# Patient Record
Sex: Male | Born: 1980 | Race: White | Hispanic: No | Marital: Married | Smoking: Current every day smoker
Health system: Southern US, Community
[De-identification: ages and names within clinical notes are randomized; demographics above are authoritative.]

## PROBLEM LIST (undated history)

## (undated) DIAGNOSIS — F1910 Other psychoactive substance abuse, uncomplicated: Secondary | ICD-10-CM

---

## 2019-01-30 ENCOUNTER — Inpatient Hospital Stay (HOSPITAL_COMMUNITY): Payer: Self-pay

## 2019-01-30 ENCOUNTER — Encounter (HOSPITAL_COMMUNITY): Payer: Self-pay

## 2019-01-30 ENCOUNTER — Inpatient Hospital Stay (HOSPITAL_COMMUNITY)
Admission: EM | Admit: 2019-01-30 | Discharge: 2019-01-30 | DRG: 918 | Discharge status: Against Medical Advice | Payer: Self-pay | Attending: Internal Medicine | Admitting: Internal Medicine

## 2019-01-30 ENCOUNTER — Other Ambulatory Visit: Payer: Self-pay

## 2019-01-30 ENCOUNTER — Emergency Department (HOSPITAL_COMMUNITY): Payer: Self-pay

## 2019-01-30 DIAGNOSIS — F1510 Other stimulant abuse, uncomplicated: Secondary | ICD-10-CM | POA: Diagnosis present

## 2019-01-30 DIAGNOSIS — N189 Chronic kidney disease, unspecified: Secondary | ICD-10-CM

## 2019-01-30 DIAGNOSIS — X58XXXA Exposure to other specified factors, initial encounter: Secondary | ICD-10-CM | POA: Diagnosis present

## 2019-01-30 DIAGNOSIS — R443 Hallucinations, unspecified: Secondary | ICD-10-CM | POA: Diagnosis present

## 2019-01-30 DIAGNOSIS — Z23 Encounter for immunization: Secondary | ICD-10-CM

## 2019-01-30 DIAGNOSIS — F22 Delusional disorders: Secondary | ICD-10-CM

## 2019-01-30 DIAGNOSIS — T43621A Poisoning by amphetamines, accidental (unintentional), initial encounter: Principal | ICD-10-CM | POA: Diagnosis present

## 2019-01-30 DIAGNOSIS — Z720 Tobacco use: Secondary | ICD-10-CM

## 2019-01-30 DIAGNOSIS — R410 Disorientation, unspecified: Secondary | ICD-10-CM

## 2019-01-30 DIAGNOSIS — N179 Acute kidney failure, unspecified: Secondary | ICD-10-CM

## 2019-01-30 DIAGNOSIS — F1910 Other psychoactive substance abuse, uncomplicated: Secondary | ICD-10-CM

## 2019-01-30 DIAGNOSIS — S41112A Laceration without foreign body of left upper arm, initial encounter: Secondary | ICD-10-CM | POA: Diagnosis present

## 2019-01-30 DIAGNOSIS — Z888 Allergy status to other drugs, medicaments and biological substances status: Secondary | ICD-10-CM

## 2019-01-30 DIAGNOSIS — T148XXA Other injury of unspecified body region, initial encounter: Secondary | ICD-10-CM

## 2019-01-30 DIAGNOSIS — T50901A Poisoning by unspecified drugs, medicaments and biological substances, accidental (unintentional), initial encounter: Secondary | ICD-10-CM | POA: Diagnosis present

## 2019-01-30 DIAGNOSIS — T1490XA Injury, unspecified, initial encounter: Secondary | ICD-10-CM

## 2019-01-30 DIAGNOSIS — F17210 Nicotine dependence, cigarettes, uncomplicated: Secondary | ICD-10-CM | POA: Diagnosis present

## 2019-01-30 DIAGNOSIS — L089 Local infection of the skin and subcutaneous tissue, unspecified: Secondary | ICD-10-CM

## 2019-01-30 DIAGNOSIS — T50902A Poisoning by unspecified drugs, medicaments and biological substances, intentional self-harm, initial encounter: Secondary | ICD-10-CM

## 2019-01-30 DIAGNOSIS — L03114 Cellulitis of left upper limb: Secondary | ICD-10-CM | POA: Diagnosis present

## 2019-01-30 DIAGNOSIS — R4182 Altered mental status, unspecified: Secondary | ICD-10-CM

## 2019-01-30 DIAGNOSIS — F1210 Cannabis abuse, uncomplicated: Secondary | ICD-10-CM | POA: Diagnosis present

## 2019-01-30 HISTORY — DX: Other psychoactive substance abuse, uncomplicated: F19.10

## 2019-01-30 LAB — CK: CK TOTAL: 905 U/L — AB (ref 49–397)

## 2019-01-30 LAB — URINALYSIS, ROUTINE W REFLEX MICROSCOPIC
Bilirubin Urine: NEGATIVE
Glucose, UA: NEGATIVE mg/dL
Ketones, ur: 20 mg/dL — AB
Nitrite: NEGATIVE
PH: 5 (ref 5.0–8.0)
Protein, ur: 100 mg/dL — AB
SPECIFIC GRAVITY, URINE: 1.025 (ref 1.005–1.030)

## 2019-01-30 LAB — CBC WITH DIFFERENTIAL/PLATELET
Abs Immature Granulocytes: 0.24 10*3/uL — ABNORMAL HIGH (ref 0.00–0.07)
BASOS ABS: 0.1 10*3/uL (ref 0.0–0.1)
Basophils Relative: 0 %
EOS PCT: 0 %
Eosinophils Absolute: 0 10*3/uL (ref 0.0–0.5)
HCT: 52.5 % — ABNORMAL HIGH (ref 39.0–52.0)
Hemoglobin: 17.4 g/dL — ABNORMAL HIGH (ref 13.0–17.0)
Immature Granulocytes: 1 %
Lymphocytes Relative: 3 %
Lymphs Abs: 0.8 10*3/uL (ref 0.7–4.0)
MCH: 29.9 pg (ref 26.0–34.0)
MCHC: 33.1 g/dL (ref 30.0–36.0)
MCV: 90.2 fL (ref 80.0–100.0)
Monocytes Absolute: 1.7 10*3/uL — ABNORMAL HIGH (ref 0.1–1.0)
Monocytes Relative: 6 %
NRBC: 0 % (ref 0.0–0.2)
Neutro Abs: 24.1 10*3/uL — ABNORMAL HIGH (ref 1.7–7.7)
Neutrophils Relative %: 90 %
Platelets: 368 10*3/uL (ref 150–400)
RBC: 5.82 MIL/uL — ABNORMAL HIGH (ref 4.22–5.81)
RDW: 14.4 % (ref 11.5–15.5)
WBC: 26.8 10*3/uL — AB (ref 4.0–10.5)

## 2019-01-30 LAB — COMPREHENSIVE METABOLIC PANEL
ALT: 33 U/L (ref 0–44)
AST: 66 U/L — ABNORMAL HIGH (ref 15–41)
Albumin: 5.4 g/dL — ABNORMAL HIGH (ref 3.5–5.0)
Alkaline Phosphatase: 90 U/L (ref 38–126)
Anion gap: 18 — ABNORMAL HIGH (ref 5–15)
BUN: 22 mg/dL — AB (ref 6–20)
CO2: 16 mmol/L — ABNORMAL LOW (ref 22–32)
Calcium: 10.1 mg/dL (ref 8.9–10.3)
Chloride: 102 mmol/L (ref 98–111)
Creatinine, Ser: 2.31 mg/dL — ABNORMAL HIGH (ref 0.61–1.24)
GFR calc Af Amer: 40 mL/min — ABNORMAL LOW (ref >60)
GFR calc non Af Amer: 35 mL/min — ABNORMAL LOW (ref >60)
Glucose, Bld: 93 mg/dL (ref 70–99)
Potassium: 5.1 mmol/L (ref 3.5–5.1)
Sodium: 136 mmol/L (ref 135–145)
Total Bilirubin: 2 mg/dL — ABNORMAL HIGH (ref 0.3–1.2)
Total Protein: 9.3 g/dL — ABNORMAL HIGH (ref 6.5–8.1)

## 2019-01-30 LAB — ACETAMINOPHEN LEVEL: Acetaminophen (Tylenol), Serum: <10 ug/mL — ABNORMAL LOW (ref 10–30)

## 2019-01-30 LAB — RAPID URINE DRUG SCREEN, HOSP PERFORMED
Amphetamines: POSITIVE — AB
Barbiturates: NOT DETECTED
Benzodiazepines: NOT DETECTED
Cocaine: NOT DETECTED
Opiates: NOT DETECTED
Tetrahydrocannabinol: POSITIVE — AB

## 2019-01-30 LAB — ETHANOL: Alcohol, Ethyl (B): <10 mg/dL (ref <10)

## 2019-01-30 LAB — PROCALCITONIN: Procalcitonin: 2.84 ng/mL

## 2019-01-30 LAB — LACTIC ACID, PLASMA: Lactic Acid, Venous: 0.9 mmol/L (ref 0.5–1.9)

## 2019-01-30 LAB — SALICYLATE LEVEL: Salicylate Lvl: <7 mg/dL (ref 2.8–30.0)

## 2019-01-30 MED ORDER — VANCOMYCIN HCL 10 G IV SOLR: 2000.0000 mg | INTRAVENOUS | Status: DC

## 2019-01-30 MED ORDER — TETANUS-DIPHTH-ACELL PERTUSSIS 5-2.5-18.5 LF-MCG/0.5 IM SUSP
0.5000 mL | Freq: Once | INTRAMUSCULAR | Status: AC
  Administered 2019-01-30: 0.5 mL via INTRAMUSCULAR
  Filled 2019-01-30: qty 0.5

## 2019-01-30 MED ORDER — ONDANSETRON HCL 4 MG/2ML IJ SOLN: 4.0000 mg | Freq: Four times a day (QID) | INTRAMUSCULAR | Status: DC | PRN

## 2019-01-30 MED ORDER — ENOXAPARIN SODIUM 30 MG/0.3ML ~~LOC~~ SOLN: 30.0000 mg | SUBCUTANEOUS | Status: DC

## 2019-01-30 MED ORDER — LIDOCAINE-EPINEPHRINE (PF) 2 %-1:200000 IJ SOLN
10.0000 mL | Freq: Once | INTRAMUSCULAR | Status: AC
  Administered 2019-01-30: 10 mL via INTRADERMAL
  Filled 2019-01-30: qty 20

## 2019-01-30 MED ORDER — ACETAMINOPHEN 650 MG RE SUPP: 650.0000 mg | Freq: Four times a day (QID) | RECTAL | Status: DC | PRN

## 2019-01-30 MED ORDER — VANCOMYCIN HCL 10 G IV SOLR
2000.0000 mg | Freq: Once | INTRAVENOUS | Status: AC
  Administered 2019-01-30: 2000 mg via INTRAVENOUS
  Filled 2019-01-30: qty 2000

## 2019-01-30 MED ORDER — SODIUM CHLORIDE 0.9 % IV SOLN
2.0000 g | Freq: Once | INTRAVENOUS | Status: AC
  Administered 2019-01-30: 2 g via INTRAVENOUS
  Filled 2019-01-30: qty 2

## 2019-01-30 MED ORDER — ONDANSETRON HCL 4 MG PO TABS: 4.0000 mg | ORAL_TABLET | Freq: Four times a day (QID) | ORAL | Status: DC | PRN

## 2019-01-30 MED ORDER — NICOTINE 14 MG/24HR TD PT24: 14.0000 mg | MEDICATED_PATCH | Freq: Every day | TRANSDERMAL | Status: DC

## 2019-01-30 MED ORDER — SODIUM CHLORIDE 0.9 % IV BOLUS
1000.0000 mL | Freq: Once | INTRAVENOUS | Status: AC
  Administered 2019-01-30: 1000 mL via INTRAVENOUS

## 2019-01-30 MED ORDER — ZIPRASIDONE MESYLATE 20 MG IM SOLR
20.0000 mg | Freq: Once | INTRAMUSCULAR | Status: AC
  Administered 2019-01-30: 20 mg via INTRAMUSCULAR
  Filled 2019-01-30: qty 20

## 2019-01-30 MED ORDER — SODIUM CHLORIDE 0.9 % IV SOLN: INTRAVENOUS | Status: DC

## 2019-01-30 MED ORDER — ACETAMINOPHEN 325 MG PO TABS
650.0000 mg | ORAL_TABLET | Freq: Four times a day (QID) | ORAL | Status: DC | PRN
  Filled 2019-01-30: qty 2

## 2019-01-30 MED ORDER — SODIUM CHLORIDE 0.9 % IV SOLN: 2.0000 g | Freq: Two times a day (BID) | INTRAVENOUS | Status: DC

## 2019-01-30 MED ORDER — BACITRACIN ZINC 500 UNIT/GM EX OINT
TOPICAL_OINTMENT | Freq: Two times a day (BID) | CUTANEOUS | Status: DC
  Administered 2019-01-30: 1 via TOPICAL

## 2019-01-30 NOTE — ED Notes (Signed)
Pt states he ate 15g of dope. [employer] confirms this
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
## 2019-01-30 NOTE — ED Provider Notes (Signed)
Sign out to me from Dr. Erma Heritage pending labs patient has evidence of acute kidney injury with a creatinine of 2.31.  Does have leukocytosis likely from stress with white count 26.8 thousand.  Patient will receive 3 L of saline here and require admission   Lorre Nick, MD 01/30/19 1134

## 2019-01-30 NOTE — Discharge Summary (Signed)
Discharge summary-- Patient left AMA.    Patient is alert and oriented x3.  Patient understands his diagnosis of infection/arm cellulitis.  Patient decided to leave AGAINST MEDICAL ADVICE.  The consequences of leaving AGAINST MEDICAL ADVICE was discussed with him and included but not limited to worsening of infection as well as possible worst outcome of death.  Patient states that he understood and had to leave.  He expressed understanding that he could call [phone] if he needs to come back to the ED. Based on my assessment, he has capacity to make make medical decision.   Patient left AGAINST MEDICAL ADVICE.

## 2019-01-30 NOTE — ED Notes (Signed)
Pt is not suicidal therefore SI sitter order d/c'd

## 2019-01-30 NOTE — Progress Notes (Signed)
Pharmacy Antibiotic Note  Kenneth Rose. is a 38 y.o. male admitted on 01/30/2019 with cellulitis.  Pharmacy has been consulted for vancomycin dosing. Multiple skin abrasions with some deep lacerations to the left arm.  Concerning left arm cellulitis  Plan: Vancomycin 2000 mg IV Q 24 hrs. Goal AUC 400-550. Expected AUC: 531.5 SCr used: 2.31   Height: 6\' 1"  (185.4 cm) Weight: 214 lb (97.1 kg) IBW/kg (Calculated) : 79.9  Temp (24hrs), Avg:97.8 F (36.6 C), Min:97.7 F (36.5 C), Max:97.9 F (36.6 C)  Recent Labs  Lab 01/30/19 0819  WBC 26.8*  CREATININE 2.31*    Estimated Creatinine Clearance: 53.2 mL/min (A) (by C-G formula based on SCr of 2.31 mg/dL (H)).    Allergies  Allergen Reactions  . Insulins     Antimicrobials this admission: 3/14 vanc>>  Dose adjustments this admission:  Microbiology results:  Thank you for allowing pharmacy to be a part of this patient's care.  Herby Abraham, Pharm.D [phone] 01/30/2019 1:42 PM
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
## 2019-01-30 NOTE — ED Provider Notes (Signed)
I was requested to perform laceration repair to L forearms.  Pt has multiple lacerations both deep and superficial noted to L forearm, elbow.  Evidence of retained foreign body noted both on x-rays and on exam.  Best attempt was made to remove as many shards of glass from the wounds but patient was made aware the potential of retained foreign body is possible.  LACERATION REPAIR Performed by: Fayrene Helper Authorized by: Fayrene Helper Consent: Verbal consent obtained. Risks and benefits: risks, benefits and alternatives were discussed Consent given by: patient Patient identity confirmed: provided demographic data Prepped and Draped in normal sterile fashion Wound explored  Laceration Location: L forearm  Laceration Length: 8cm (total length of 5 different lacerations)  Multiple shards of glass of various sizes were found  Anesthesia: local infiltration  Local anesthetic: lidocaine 2% w epinephrine  Anesthetic total: 8 ml  Irrigation method: syringe Amount of cleaning: standard  Skin closure: prolene 5.0  Number of sutures: 14  Technique: simple interrupted  Patient tolerance: Patient tolerated the procedure well with no immediate complications. Fayrene Helper, PA-C 01/30/19 1028    Lorre Nick, MD 01/31/19 [phone]

## 2019-01-30 NOTE — ED Notes (Signed)
Irrigated left lat ankle wound with NS, then applied nonstick dressing to area.

## 2019-01-30 NOTE — ED Notes (Signed)
Pt verbally aggressive toward staff. Also ripping off his armband and other cables that connected to the dinamap to monitor vital signs.

## 2019-01-30 NOTE — ED Notes (Signed)
Bed: WA16 Expected date:  Expected time:  Means of arrival:  Comments: Hall D  

## 2019-01-30 NOTE — Progress Notes (Signed)
See progress note from Len Childs, PharmD. MD adding Cefepime to antibiotic therapy, with request for pharmacy to dose.  SCr 2.31 with CrCl ~ 53 ml/min WBC 26.8K PCT 2.84  Plan:  Cefepime 2g IV q12h Vancomycin as previously ordered Monitor renal function, cultures, clinical course   Greer Pickerel, PharmD, BCPS Pager: [phone] 01/30/2019 4:13 PM

## 2019-01-30 NOTE — ED Provider Notes (Signed)
[facility]-EMERGENCY DEPT Provider Note   CSN: [id number] Arrival date & time: 01/30/19  0534    History   Chief Complaint Chief Complaint  Patient presents with  . Drug Overdose    HPI Kenneth Rose is a 38 y.o. male.     The history is provided by the patient.   38 year old male with no known past medical history brought in by EMS with altered mental status.  Patient reportedly was found by police.  He was acting erratically, paranoid, and hallucinating.  He was suspected to have recently done methamphetamine and he was found with other people who arrested for methamphetamine use.  On my assessment, the patient is unable to provide significant history.  He is agitated, hyperactive, and hallucinating.  He states he is seeing "a lot of things" throughout the ER.  He has flights of ideas and pressured speech.  When asked why he is here, he states that he hears God's voices, which is why is here.  He denies any overt suicidal or homicidal ration.  History reviewed. No pertinent past medical history.  There are no active problems to display for this patient.        Home Medications    Prior to Admission medications   Not on File    Family History No family history on file.  Social History Social History   Tobacco Use  . Smoking status: Unknown If Ever Smoked  Substance Use Topics  . Alcohol use: Yes  . Drug use: Yes    Types: Methamphetamines, Marijuana     Allergies   Insulins   Review of Systems Review of Systems  Unable to perform ROS: Mental status change  Skin: Positive for wound.  Psychiatric/Behavioral: Positive for agitation. The patient is nervous/anxious and is hyperactive.      Physical Exam Updated Vital Signs BP 116/84   Pulse (!) 114   Resp (!) 30   SpO2 95%   Physical Exam Vitals signs and nursing note reviewed.  Constitutional:      General: He is in acute distress.     Appearance: He is well-developed. He is  not ill-appearing.  HENT:     Head: Normocephalic and atraumatic.     Comments: No apparent head trauma.  Dry mucous membranes.    Mouth/Throat:     Mouth: Mucous membranes are moist.  Eyes:     Conjunctiva/sclera: Conjunctivae normal.     Comments: Pupils dilated, but reactive bilaterally  Neck:     Musculoskeletal: Neck supple.  Cardiovascular:     Rate and Rhythm: Regular rhythm. Tachycardia present.     Heart sounds: Normal heart sounds. No murmur. No friction rub.  Pulmonary:     Breath sounds: Normal breath sounds. No wheezing or rales.  Abdominal:     General: There is no distension.     Palpations: Abdomen is soft.     Tenderness: There is no abdominal tenderness.  Skin:    General: Skin is warm.     Capillary Refill: Capillary refill takes less than 2 seconds.     Comments: Scattered abrasions, superficial lac R elbow. Avulsion of tissue L lateral ankle, no deep or gaping wound.  Neurological:     Mental Status: He is alert.     Cranial Nerves: Cranial nerves are intact.     Motor: Motor function is intact. No abnormal muscle tone.     Coordination: Coordination is intact.  Psychiatric:  Attention and Perception: He is inattentive. He perceives auditory and visual hallucinations.        Mood and Affect: Mood is anxious.        Thought Content: Thought content is paranoid.        Judgment: Judgment is impulsive and inappropriate.     UPPER EXTREMITY EXAM: LEFT  INSPECTION & PALPATION: Multiple diffuse lacerations, abrasions L elbow with soft tissue edema. TTP over anterolateral elbow. No crepitance. No palpable foreign body.  SENSORY: Sensation is intact to light touch in:  Superficial radial nerve distribution (dorsal first web space) Median nerve distribution (tip of index finger)   Ulnar nerve distribution (tip of small finger)     MOTOR:  + Motor posterior interosseous nerve (thumb IP extension) + Anterior interosseous nerve (thumb IP flexion, index  finger DIP flexion) + Radial nerve (wrist extension) + Median nerve (palpable firing thenar mass) + Ulnar nerve (palpable firing of first dorsal interosseous muscle)  VASCULAR: 2+ radial pulse Brisk capillary refill < 2 sec, fingers warm and well-perfused   ED Treatments / Results  Labs (all labs ordered are listed, but only abnormal results are displayed) Labs Reviewed  COMPREHENSIVE METABOLIC PANEL  ETHANOL  RAPID URINE DRUG SCREEN, HOSP PERFORMED  CBC WITH DIFFERENTIAL/PLATELET  CK    EKG EKG Interpretation  Date/Time:  Saturday January 30 2019 06:55:11 EDT Ventricular Rate:  132 PR Interval:  136 QRS Duration: 72 QT Interval:  292 QTC Calculation: 432 R Axis:   66 Text Interpretation:  Sinus tachycardia Biatrial enlargement Abnormal ECG No old tracing to compare Confirmed by Shaune Pollack [phone]) on 01/30/2019 7:22:02 AM   Radiology No results found.  Procedures Procedures (including critical care time)  Medications Ordered in ED Medications  lidocaine-EPINEPHrine (XYLOCAINE W/EPI) 2 %-1:200000 (PF) injection 10 mL (has no administration in time range)  sodium chloride 0.9 % bolus 1,000 mL (has no administration in time range)  sodium chloride 0.9 % bolus 1,000 mL (has no administration in time range)  Tdap (BOOSTRIX) injection 0.5 mL (has no administration in time range)  bacitracin ointment (has no administration in time range)  ziprasidone (GEODON) injection 20 mg (20 mg Intramuscular Given by Other 01/30/19 6712)     Initial Impression / Assessment and Plan / ED Course  I have reviewed the triage vital signs and the nursing notes.  Pertinent labs & imaging results that were available during my care of the patient were reviewed by me and considered in my medical decision making (see chart for details).        38 yo M with no known PMHx here with AMS. Clinically, suspect stimulant abuse with acute intoxication, less likely primary mood disorder. Will  check labs. Tetanus updated. Plan to obtain plain films to rule out fx or foreign body, then close wounds (to be closed by Fayrene Helper, PA). F/u labs, including CK. Fluids given.   Plan to f/u labs, re-assess once sober.  Final Clinical Impressions(s) / ED Diagnoses   Final diagnoses:  Stimulant abuse (HCC)  Laceration of multiple sites of left upper extremity, initial encounter    ED Discharge Orders    None       Shaune Pollack, MD 01/30/19 [phone]

## 2019-01-30 NOTE — ED Notes (Signed)
Bed: [facility] Expected date:  Expected time:  Means of arrival:  Comments: 64M meth, agitated, lacs

## 2019-01-30 NOTE — ED Notes (Signed)
ED EKG delayed due to erratic pt behavior.

## 2019-01-30 NOTE — ED Notes (Signed)
Pt anxious and demanding IV be removed. He states he is leaving, "[place] go, my stuff, somebody is gonna get it" Dr Dierdre Searles in to try and speak with pt, He is aware of name, date and where he is, fully ambulatory with steady gait. Pt will not stay long enough to sign AMA, Dr Dierdre Searles did explain his need for hospitalization due to elevation in blood count related to infection. He is aware condition can deterate and even death is a possiblilty. Pt verbalizes understanding as he walks out of the room.

## 2019-01-31 LAB — HIV ANTIBODY (ROUTINE TESTING W REFLEX): HIV Screen 4th Generation wRfx: NONREACTIVE

## 2019-02-04 LAB — CULTURE, BLOOD (ROUTINE X 2)
Culture: NO GROWTH
Culture: NO GROWTH
SPECIAL REQUESTS: ADEQUATE
Special Requests: ADEQUATE

## 2020-03-06 IMAGING — US RENAL/URINARY TRACT ULTRASOUND
1 series · 14 of 25 positions shown · non-contrast
Comparison: None.

CLINICAL DATA: 37-year-old male with acute on chronic renal failure

EXAM:
RENAL / URINARY TRACT ULTRASOUND COMPLETE

[Series 1: renal/urinary tract ultrasound · 14 of 46 slices shown]
[im 1/46]
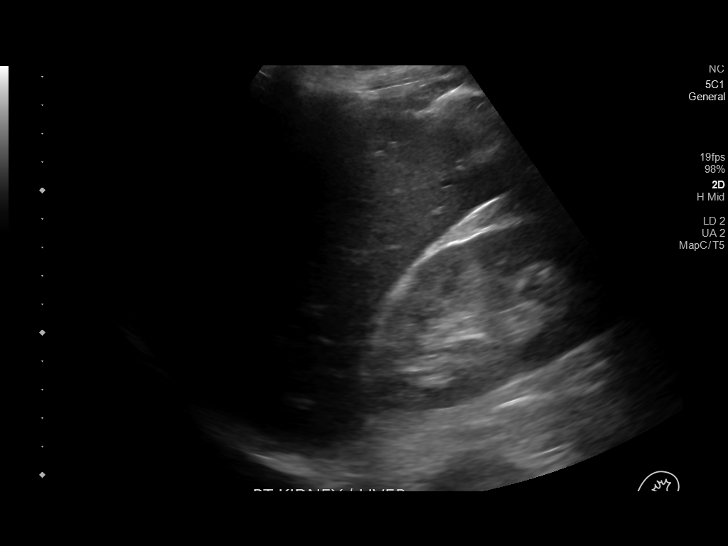
[im 4/46]
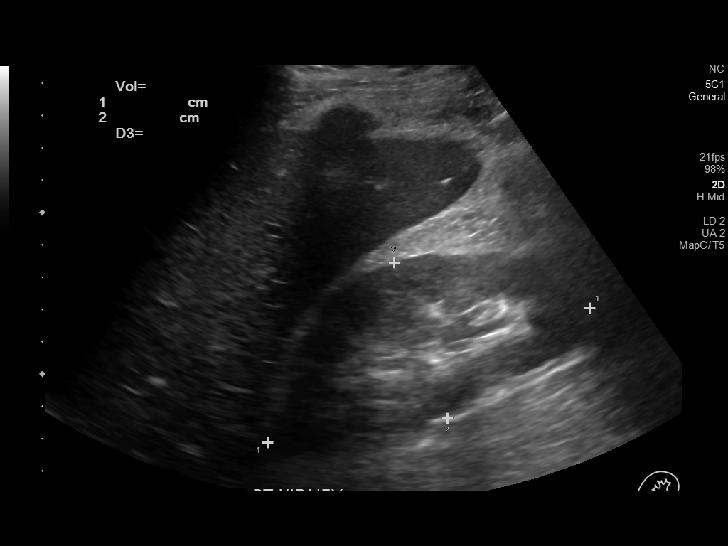
[im 8/46]
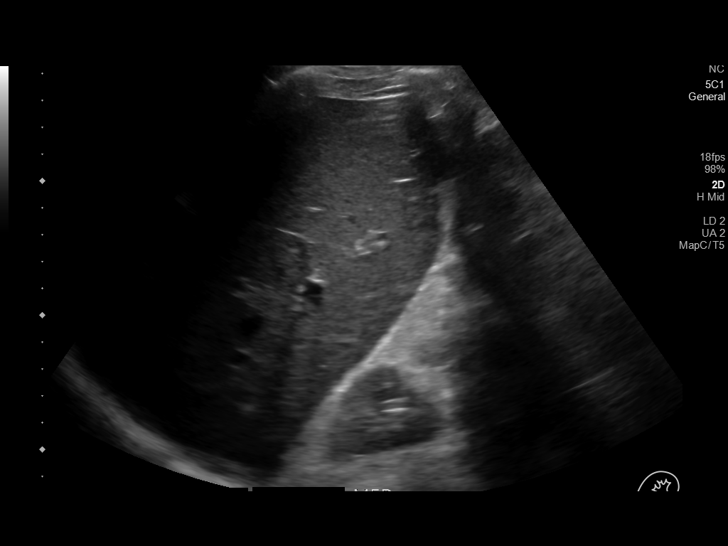
[im 12/46]
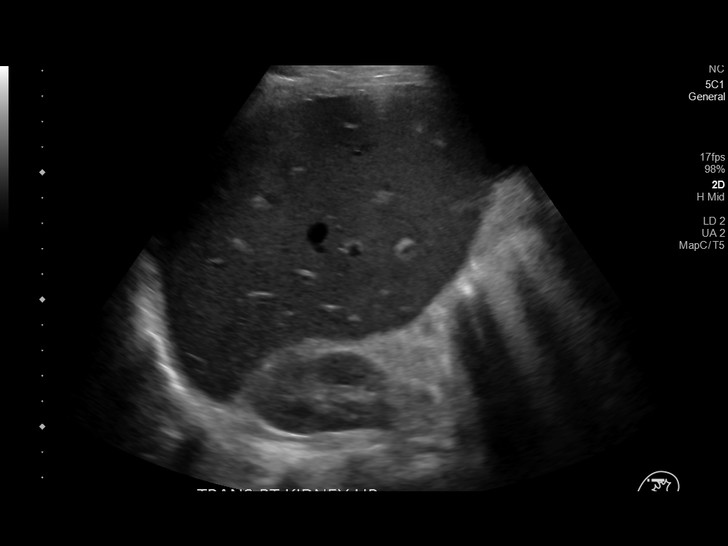
[im 16/46]
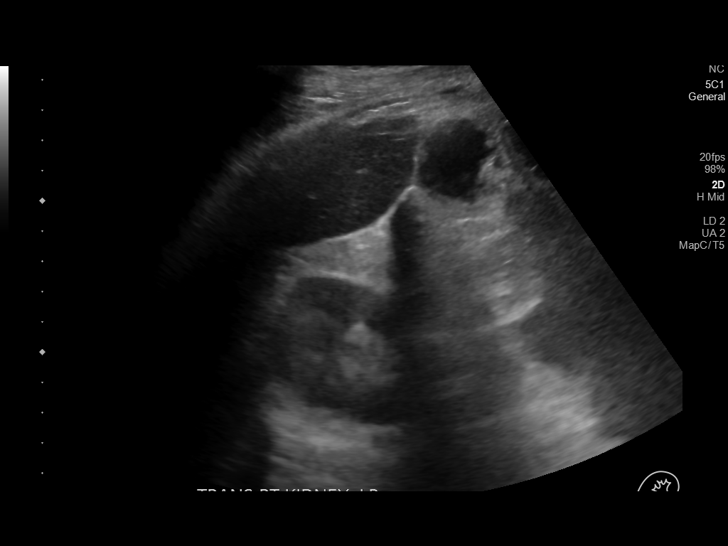
[im 17/46]
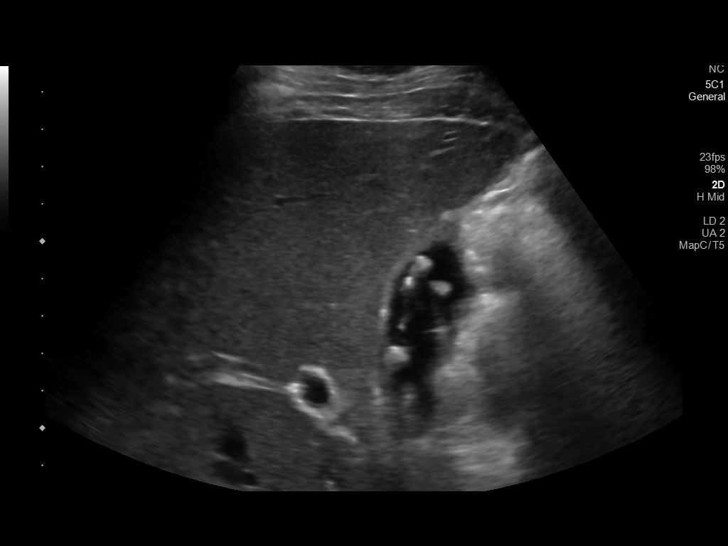
[im 21/46]
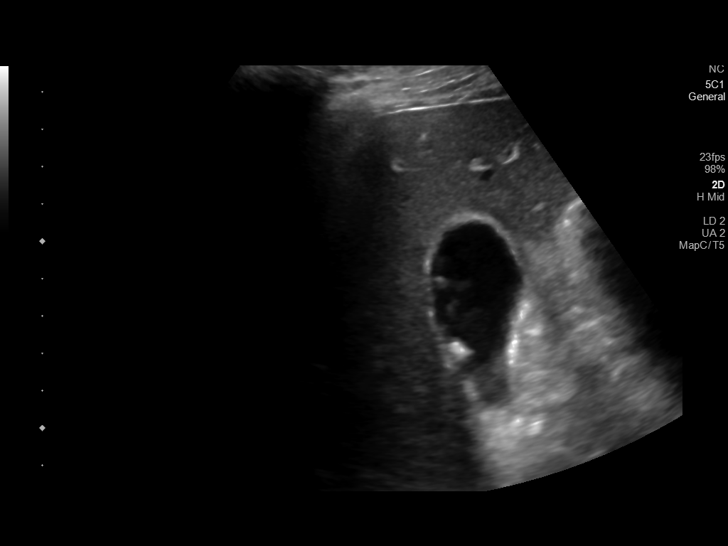
[im 25/46]
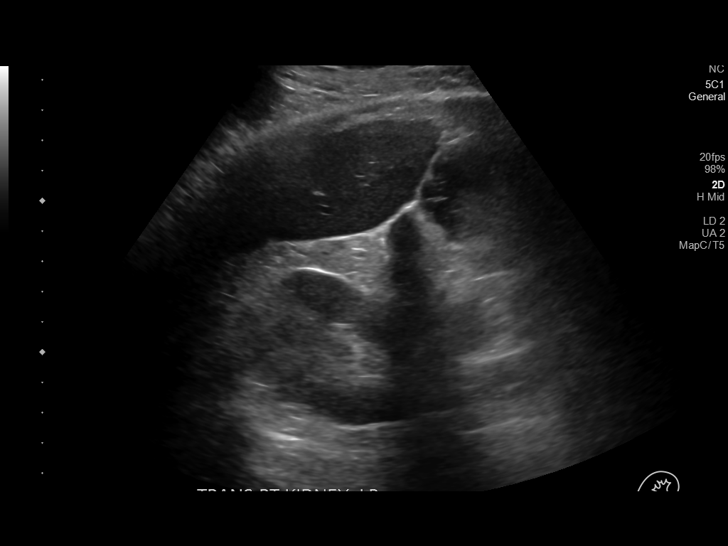
[im 29/46]
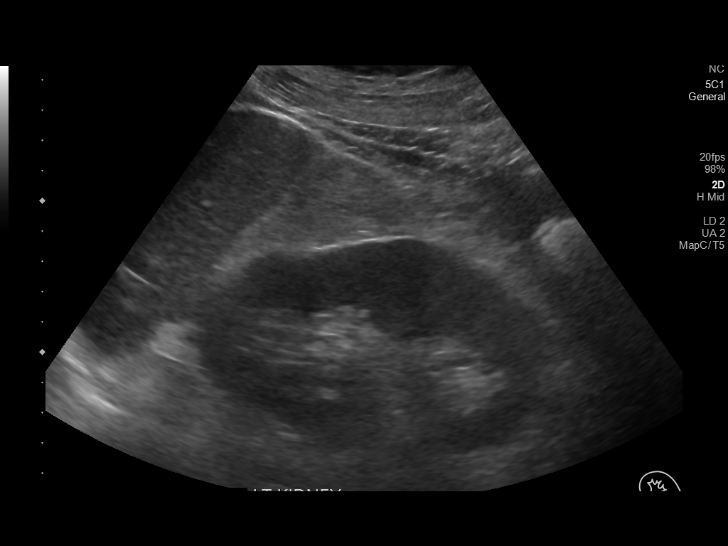
[im 31/46]
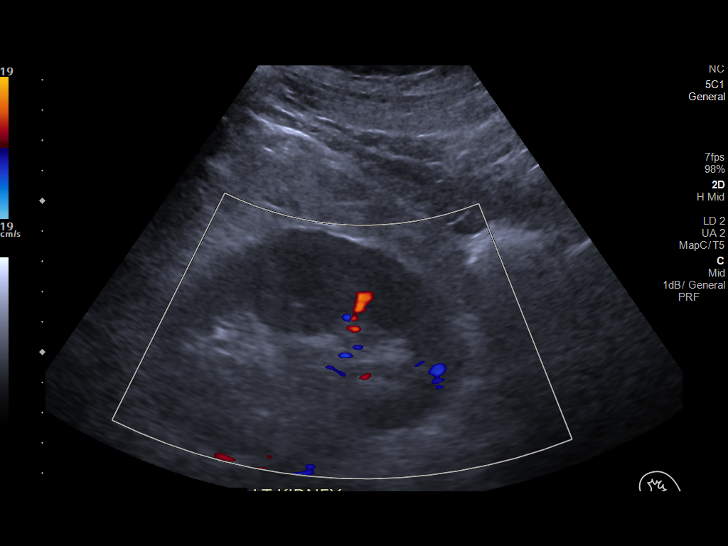
[im 34/46]
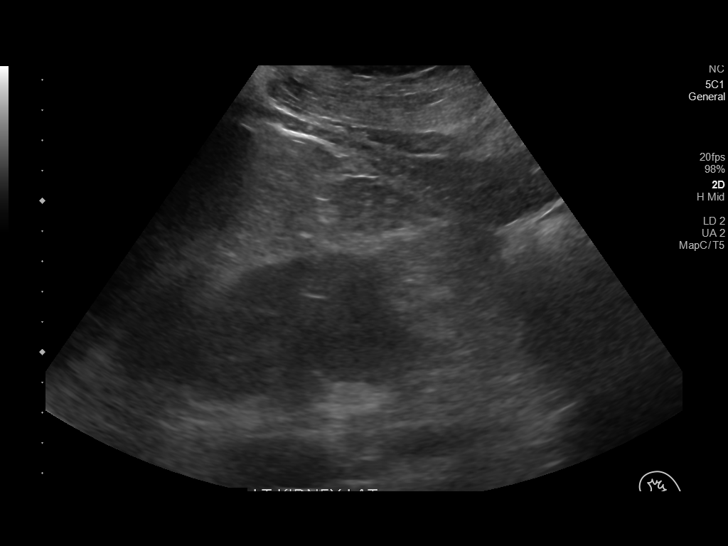
[im 38/46]
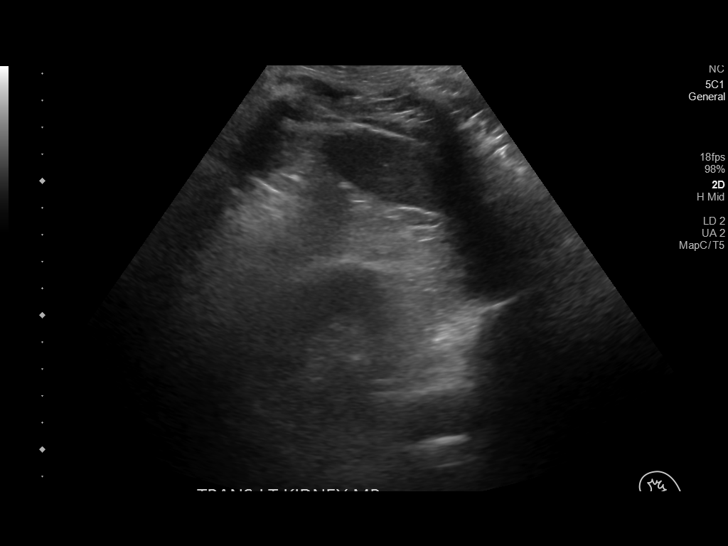
[im 42/46]
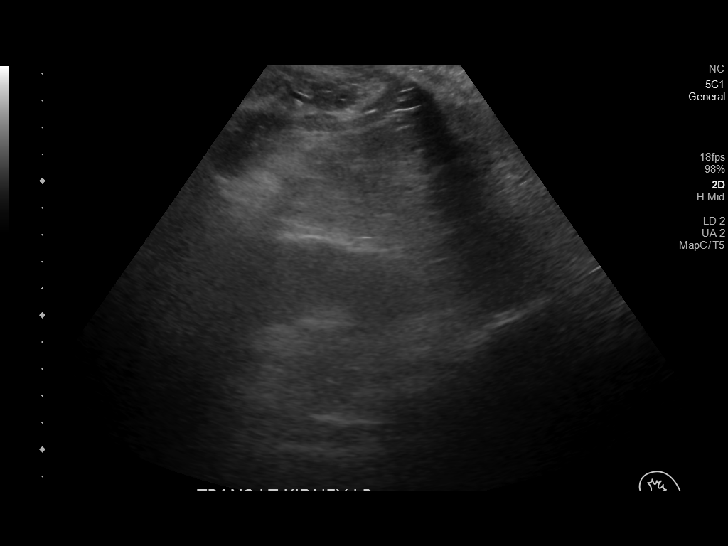
[im 46/46]
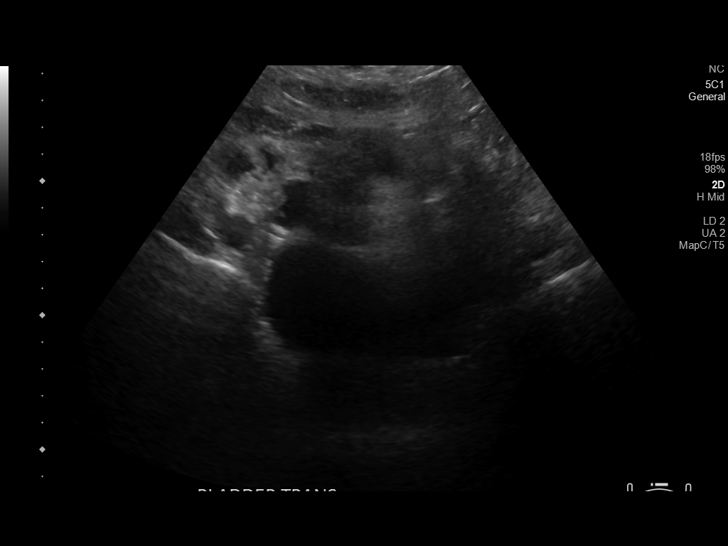

[14 of 25 positions shown; findings below may reference images not displayed]

FINDINGS: Right Kidney:

Renal measurements: 10.8 x 5.1 x 5.4 cm = volume: 155 mL mL .
Echogenicity within normal limits. No mass or hydronephrosis
visualized.

Left Kidney:

Renal measurements: 11.8 x 7.1 x 5.6 cm = volume: 248 mL mL.
Echogenicity within normal limits. No mass or hydronephrosis
visualized.

Bladder:

Appears normal for degree of bladder distention.

Other: Multiple echogenic foci within the gallbladder lumen
consistent with cholelithiasis.
IMPRESSION: 1. No evidence of hydronephrosis or other acute renal abnormality.
2. Incidental note is made of cholelithiasis.

## 2020-03-06 IMAGING — DX LEFT ELBOW - COMPLETE 3+ VIEW
4 series · 4 of 4 positions shown · non-contrast
Comparison: None.

CLINICAL DATA: 37-year-old male with lacerations to the left elbow
and forearm. Current hallucinations and paranoia.

EXAM:
LEFT ELBOW - COMPLETE 3+ VIEW

[elbow ap (1 of 3)]
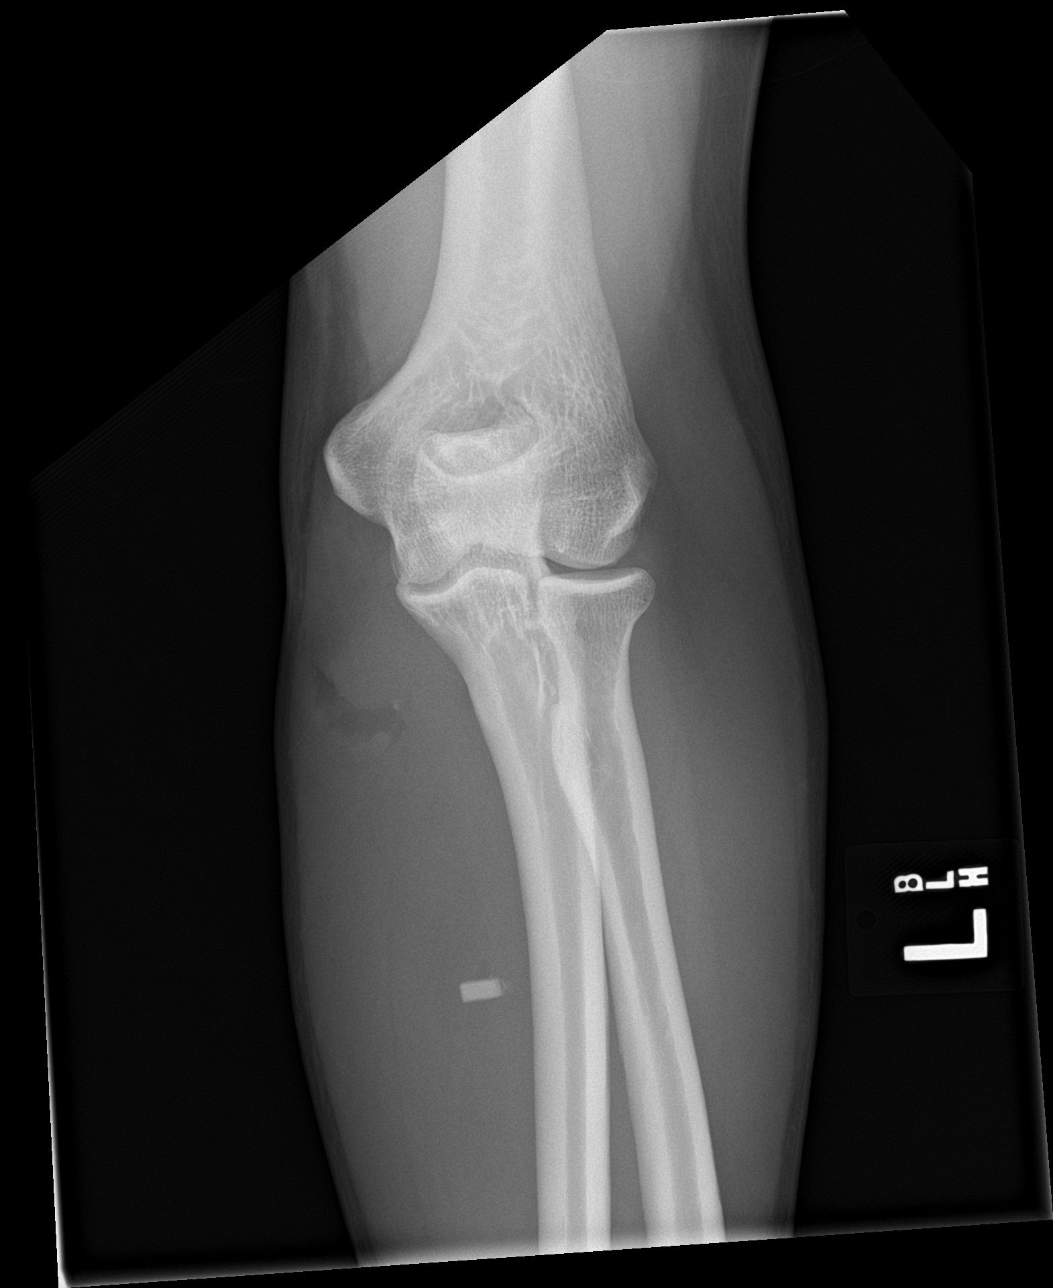

[elbow ap (2 of 3)]
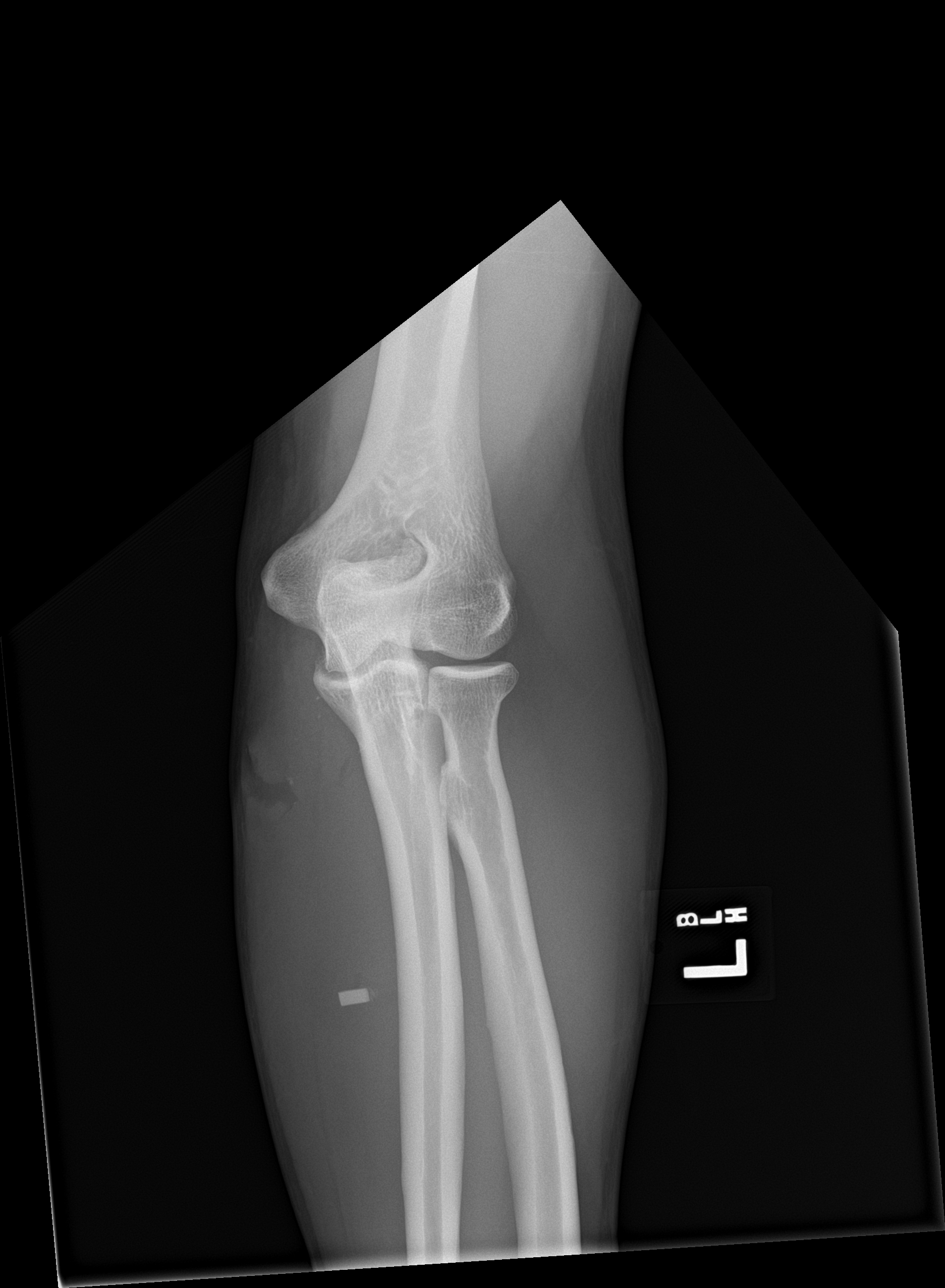

[elbow ap (3 of 3)]
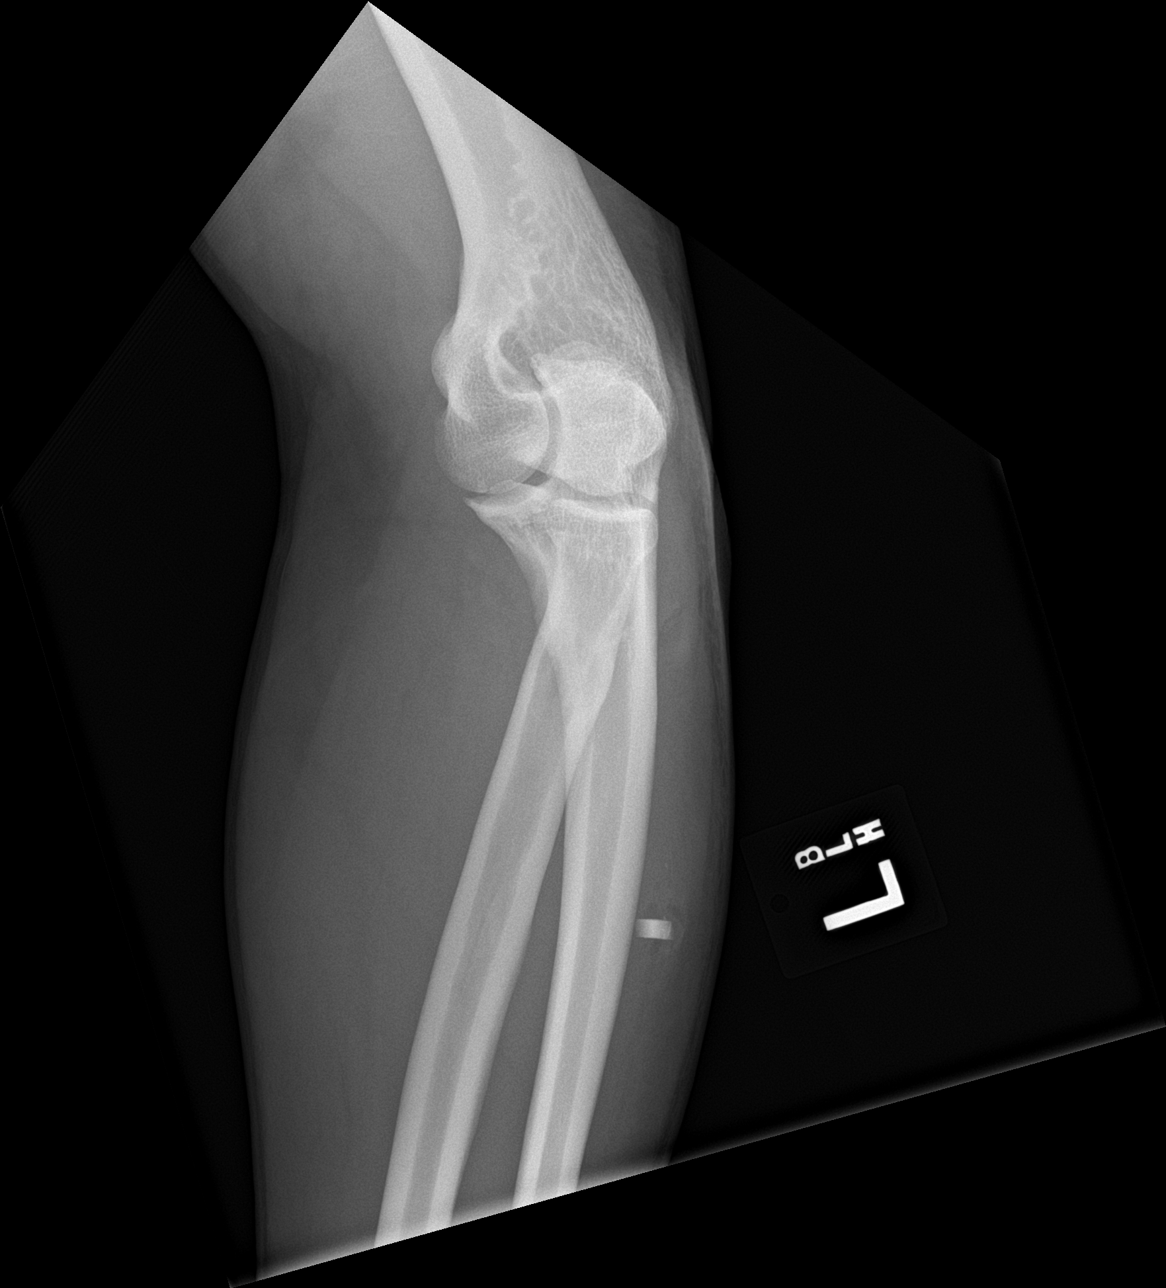

[elbow lat]
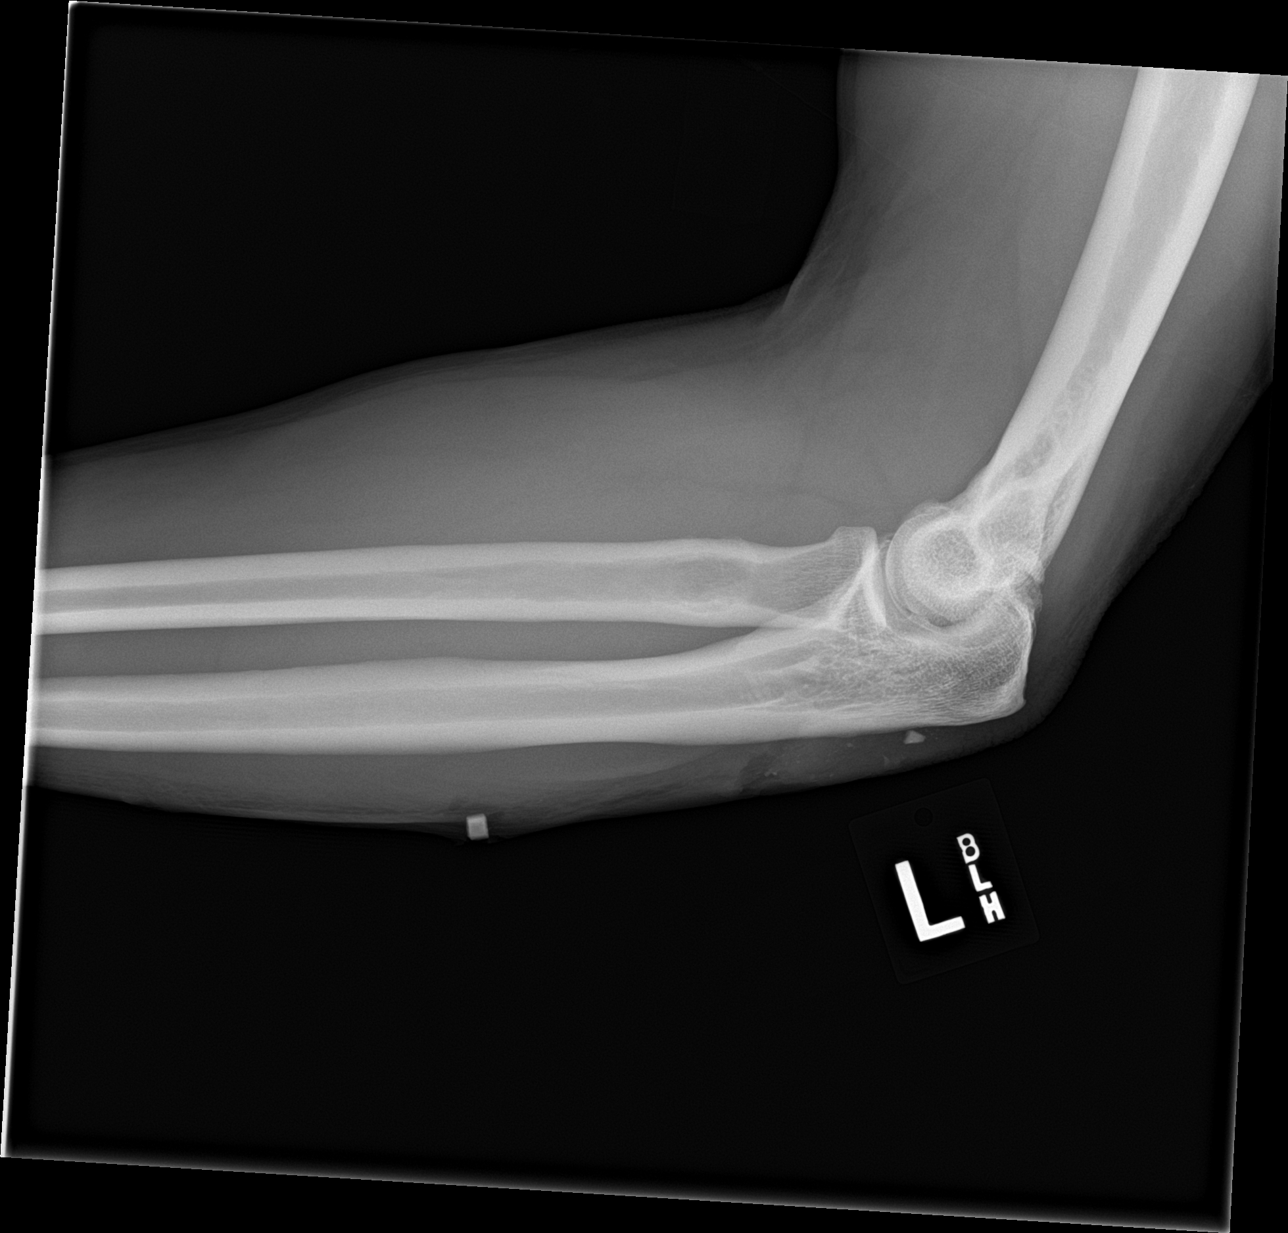

[4 of 4 positions shown; findings below may reference images not displayed]

FINDINGS: Soft tissue lacerations are noted along the posterior and medial
aspect of the proximal forearm. There is a 9 x 5 mm rectangular
foreign body just under the skin surface at the juncture of the
proximal and middle thirds of the forearm posteriorly. Additionally,
there are multiple small fragments of radiopaque material along a
larger laceration posterior to the olecranon process of the ulna.
The largest foreign body measures 5 mm. No evidence of fracture or
elbow joint effusion.
IMPRESSION: Soft tissue lacerations with multiple foreign bodies as described
above.

No evidence of fracture or joint effusion.

## 2020-03-06 IMAGING — DX RIGHT ELBOW - COMPLETE 3+ VIEW
4 series · 4 of 4 positions shown · non-contrast
Comparison: None.

CLINICAL DATA: Lacerations.

EXAM:
RIGHT ELBOW - COMPLETE 3+ VIEW

[elbow ap]
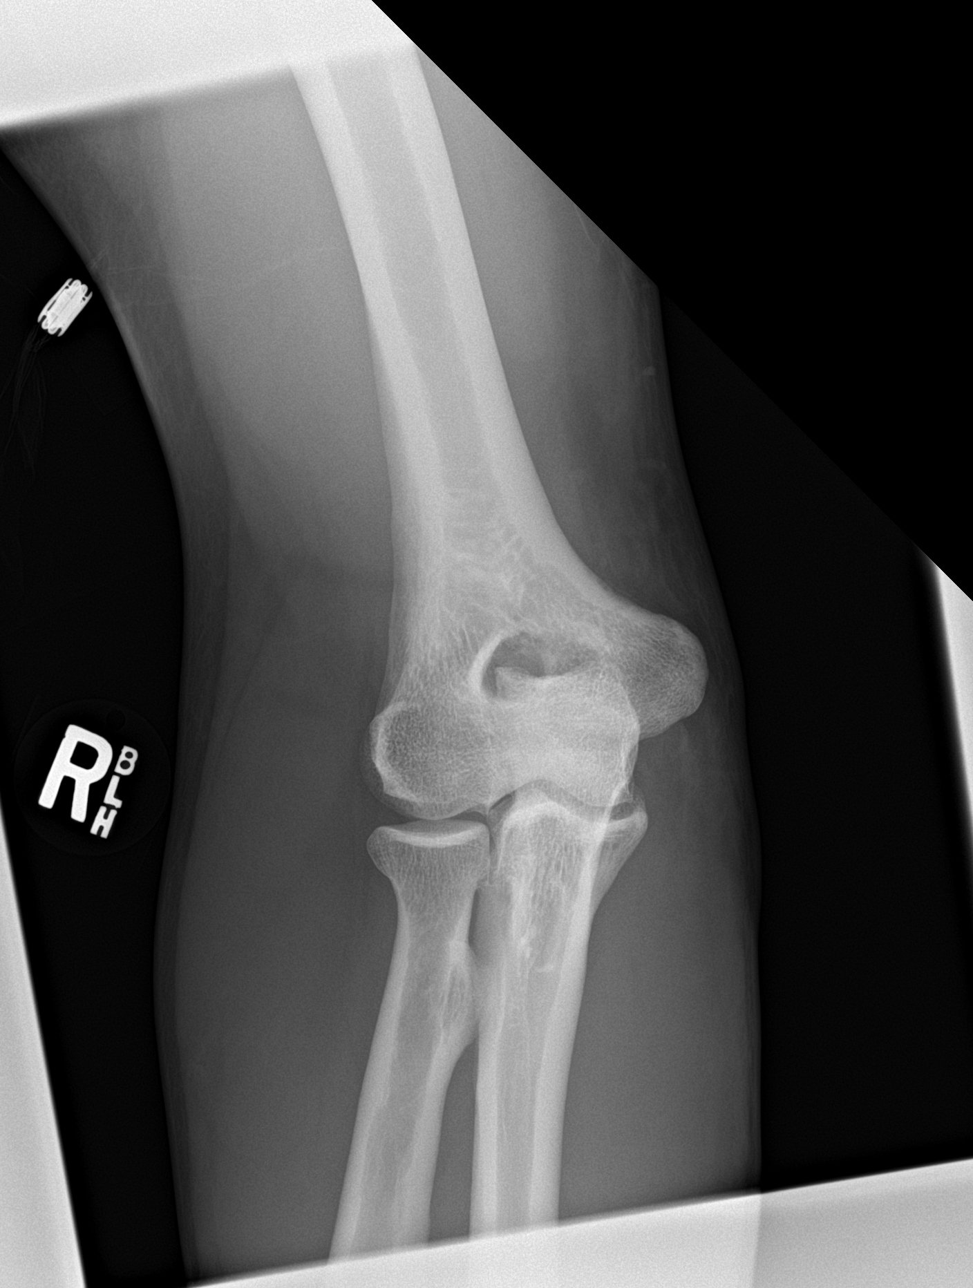

[elbow obl (1 of 2)]
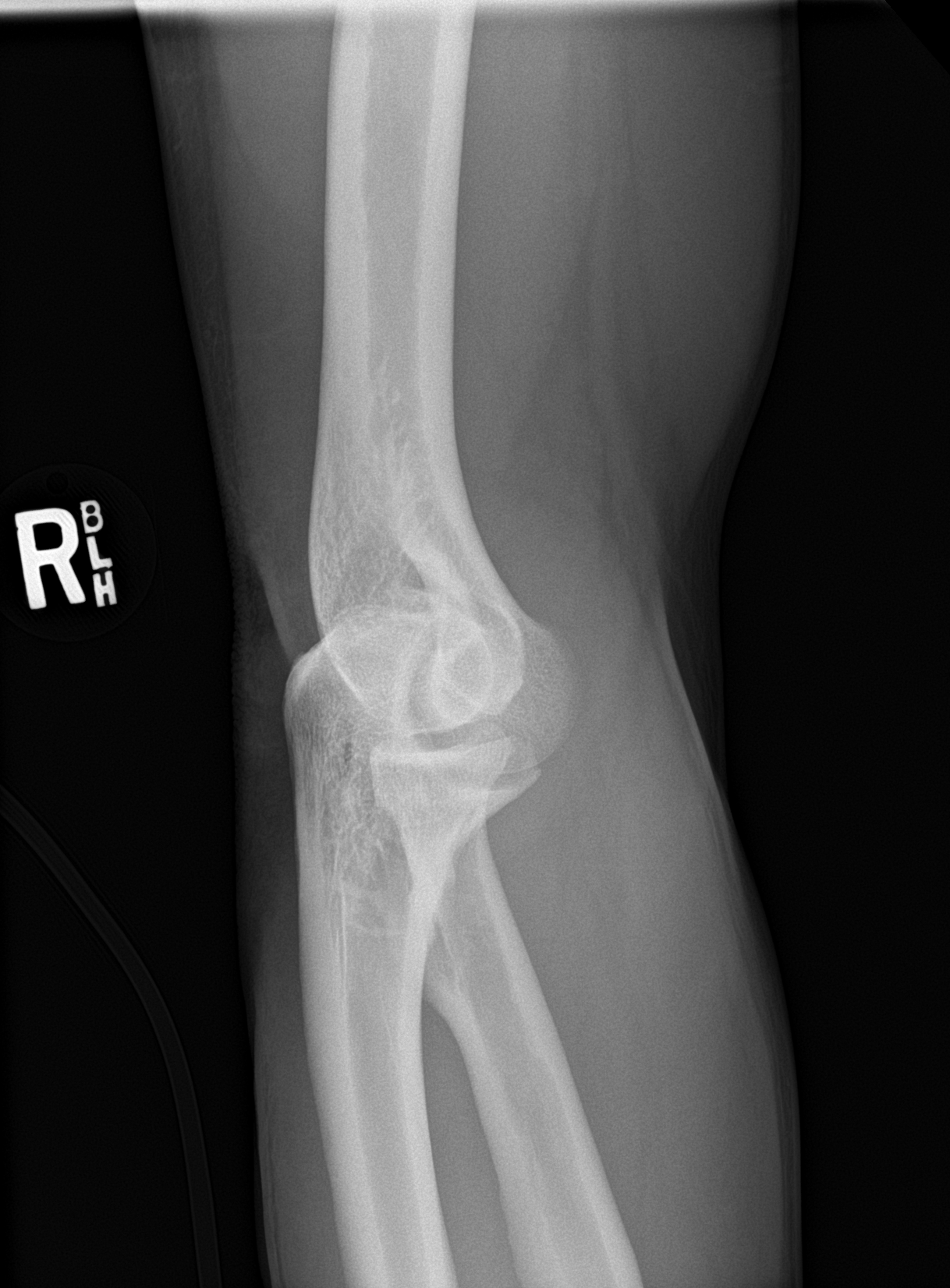

[elbow obl (2 of 2)]
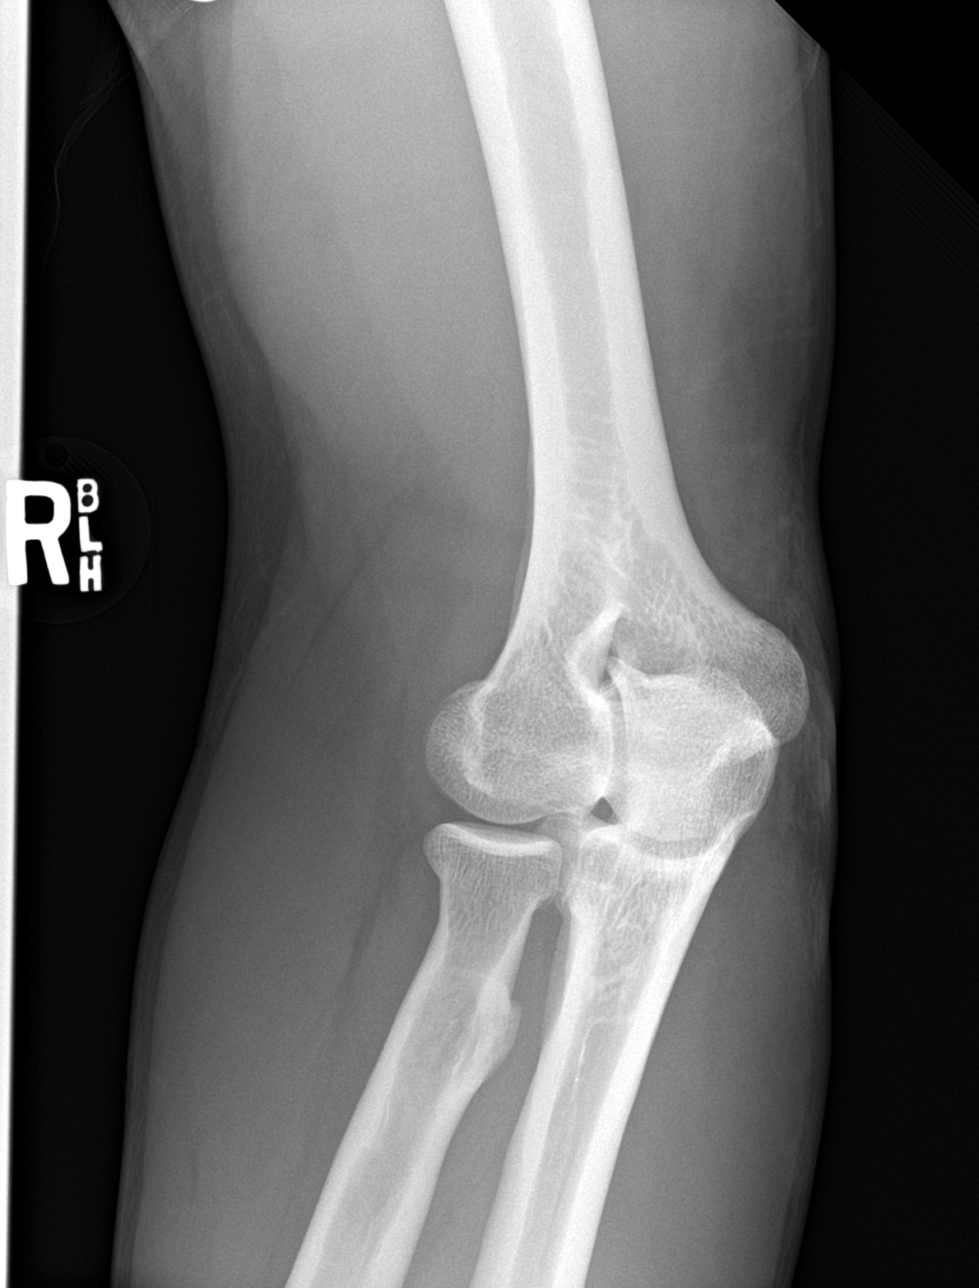

[elbow lat]
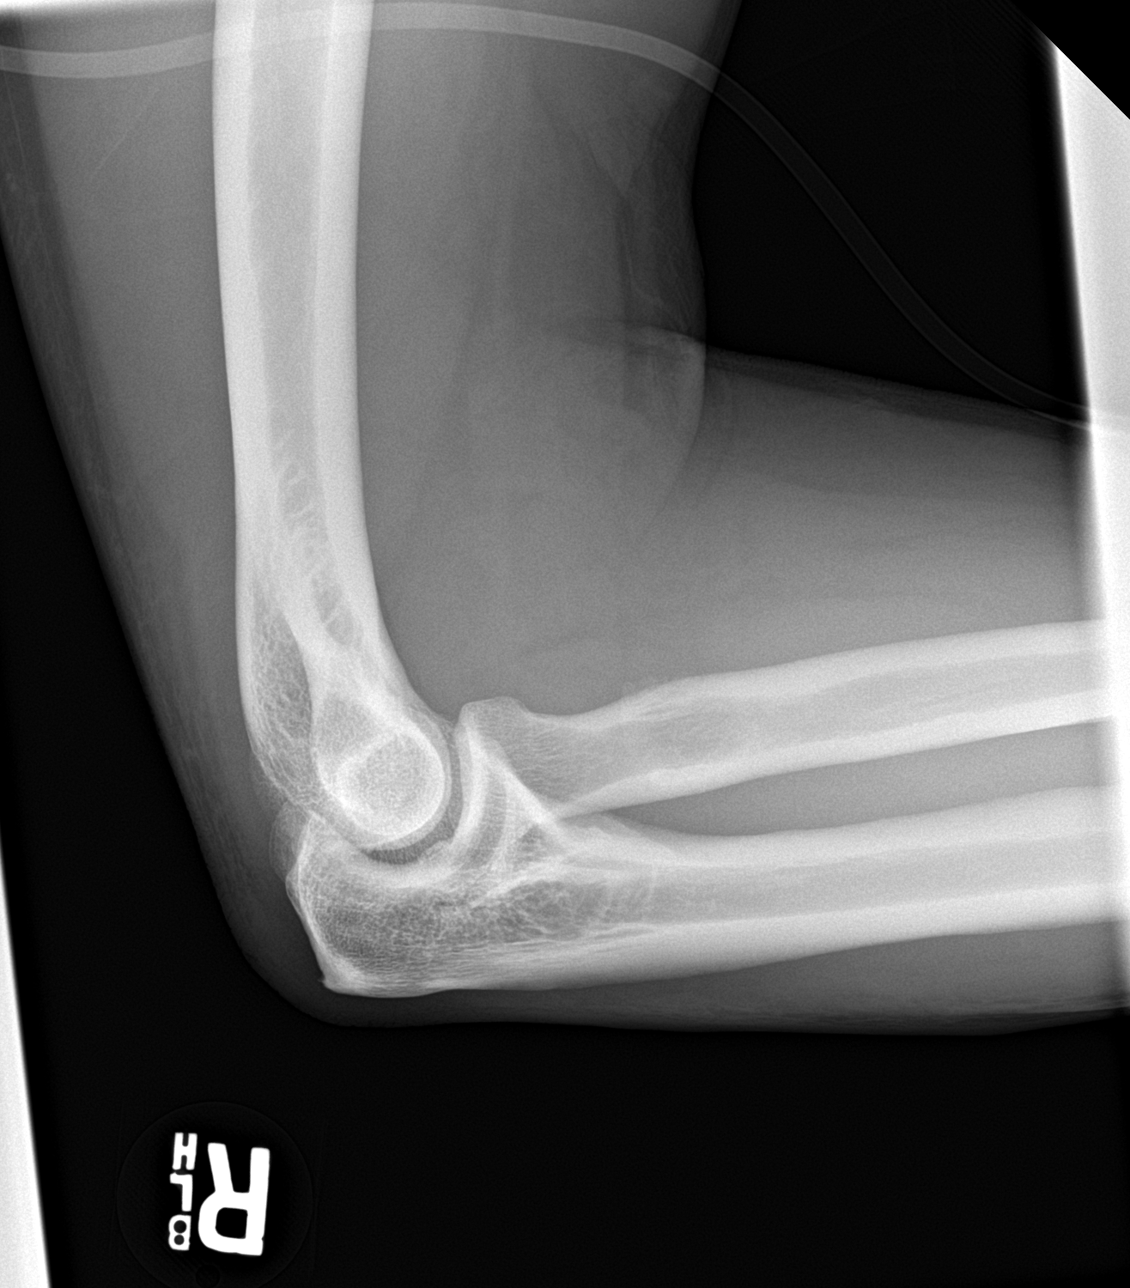

[4 of 4 positions shown; findings below may reference images not displayed]

FINDINGS: There is no evidence of fracture, dislocation, or joint effusion.
There is no evidence of arthropathy or other focal bone abnormality.
Soft tissues are unremarkable.
IMPRESSION: Negative.

## 2020-03-06 IMAGING — DX LEFT ELBOW - COMPLETE 3+ VIEW
4 series · 4 of 4 positions shown · non-contrast
Comparison: 01/30/2019

CLINICAL DATA: Multiple lacerations. Attempted foreign body removal
and laceration repair.

EXAM:
LEFT ELBOW - COMPLETE 3+ VIEW

[elbow ap]
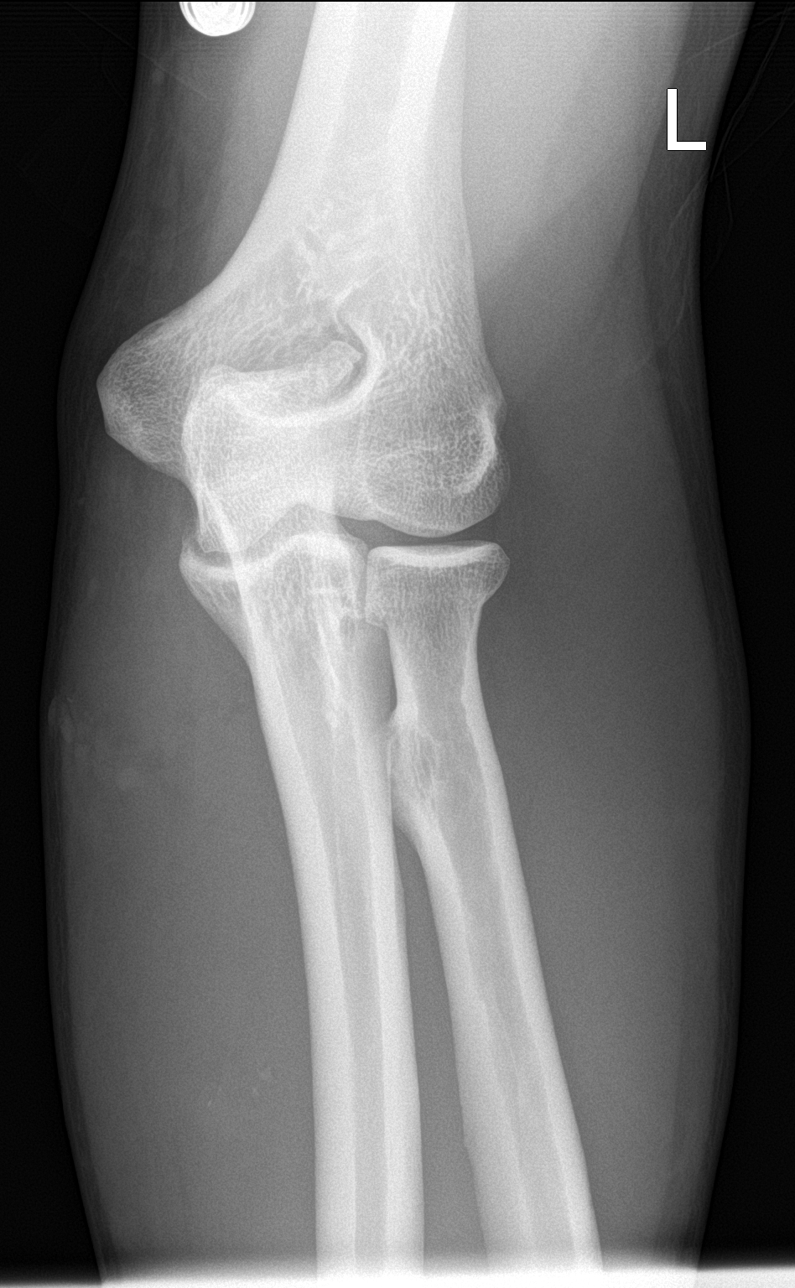

[elbow obl (1 of 2)]
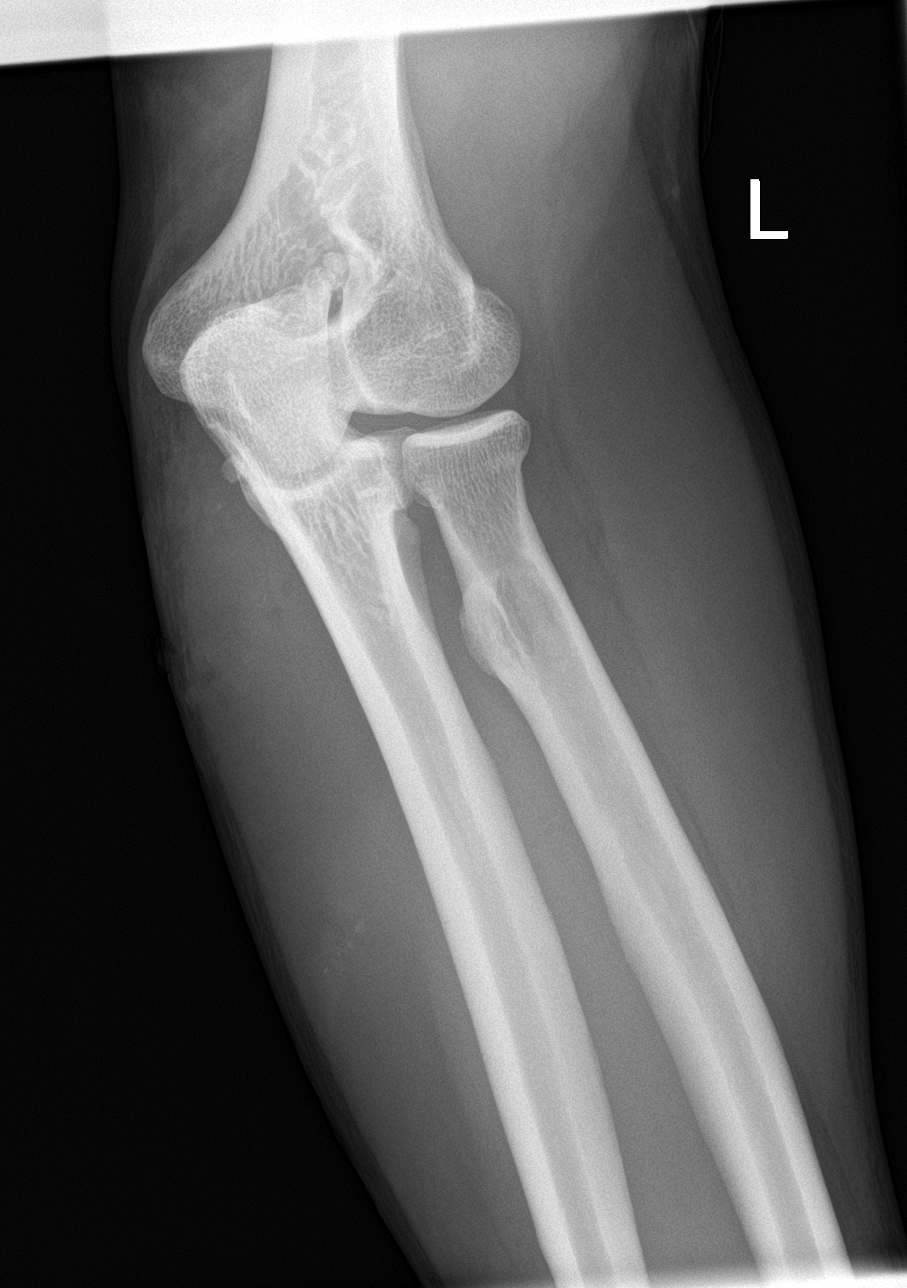

[elbow obl (2 of 2)]
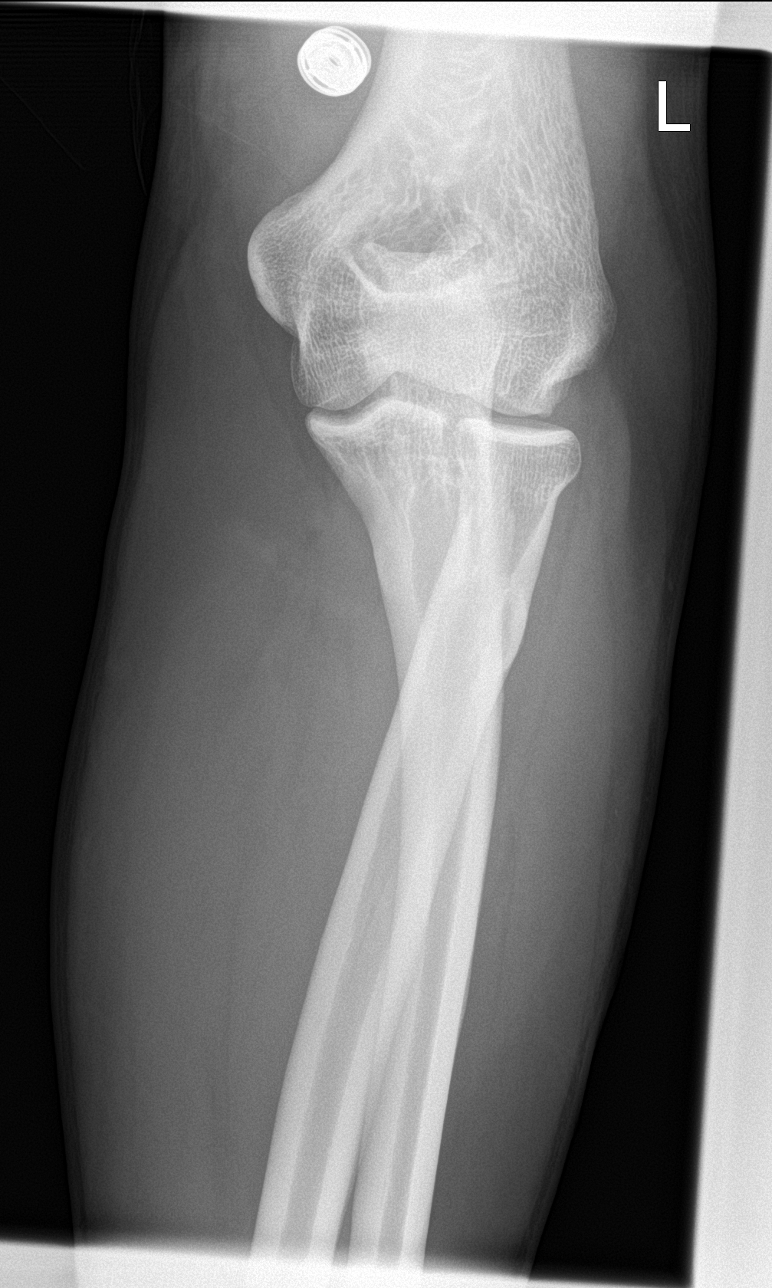

[elbow lat]
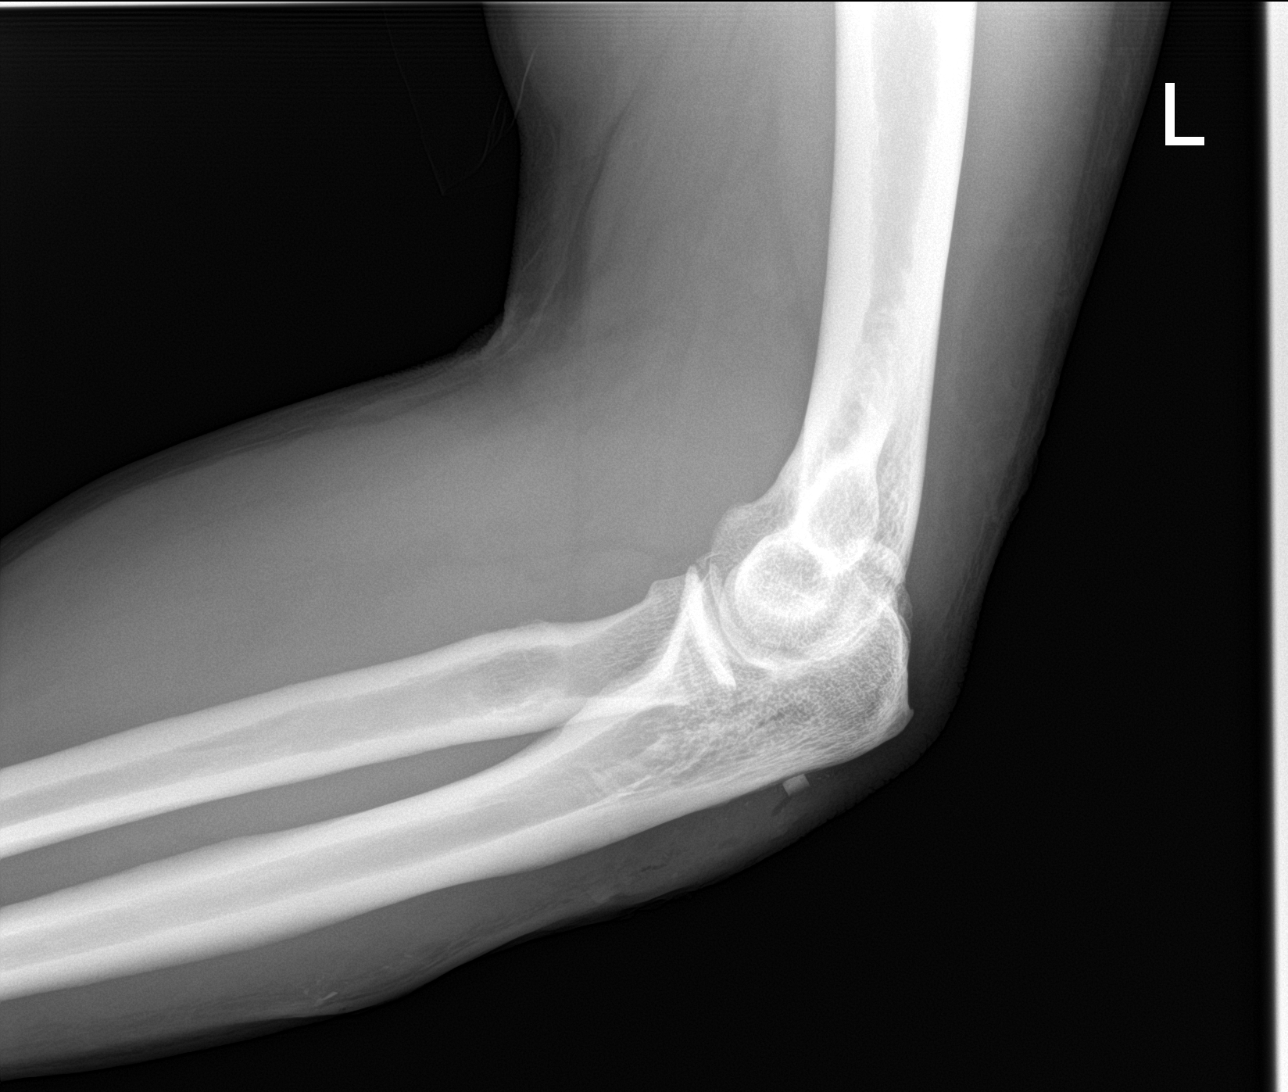

[4 of 4 positions shown; findings below may reference images not displayed]

FINDINGS: The 9 mm foreign body noted along the dorsal aspect of the ulnar
shaft on the prior films has been removed. There are few tiny
residual glass fragments in the wound. The second smaller foreign
body along the olecranon process is still present.

No fractures are identified.  No joint effusion.
IMPRESSION: Removal of the larger foreign body located along the dorsal aspect
of the proximal ulna. The smaller foreign body dorsal to the
olecranon is still present.

No acute bony findings or joint effusion.

## 2022-03-18 DEATH — deceased
